# Patient Record
Sex: Male | Born: 1955 | Race: White | Hispanic: No | Marital: Married | Smoking: Never smoker
Health system: Southern US, Community
[De-identification: ages and names within clinical notes are randomized; demographics above are authoritative.]

## PROBLEM LIST (undated history)

## (undated) DIAGNOSIS — I10 Essential (primary) hypertension: Secondary | ICD-10-CM

## (undated) HISTORY — PX: OTHER SURGICAL HISTORY: SHX169

---

## 2008-12-21 ENCOUNTER — Emergency Department: Payer: Self-pay | Admitting: Emergency Medicine

## 2009-09-07 IMAGING — CT CT HEAD WITHOUT CONTRAST
2 series · 16 of 30 positions shown, 20 images · non-contrast
Comparison: none

REASON FOR EXAM: vertigo
COMMENTS:

[Series 2: without · axial · non-contrast · 0.44mm/px · z∈[+470,+600]mm · 13 of 32 slices shown, 17 images]
[im 3/32  brain]
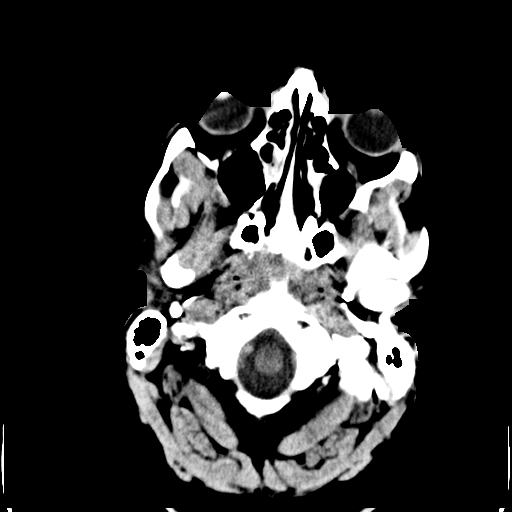
[im 3/32  bone]
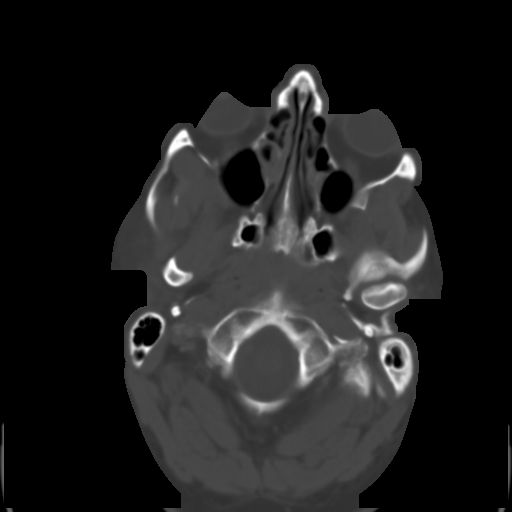
[im 5/32  brain]
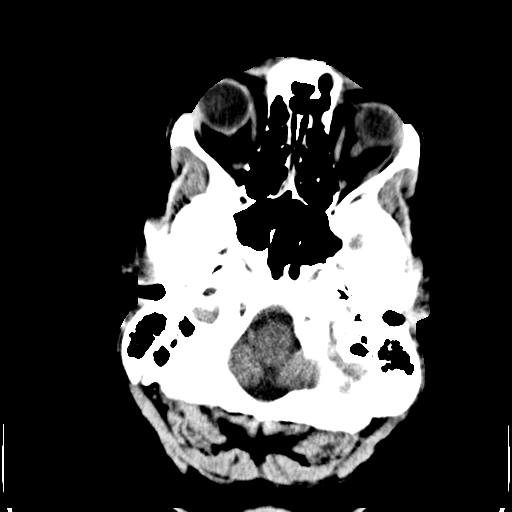
[im 7/32  brain]
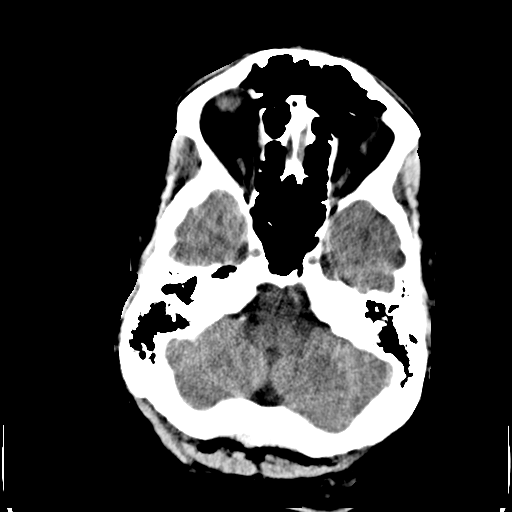
[im 9/32  brain]
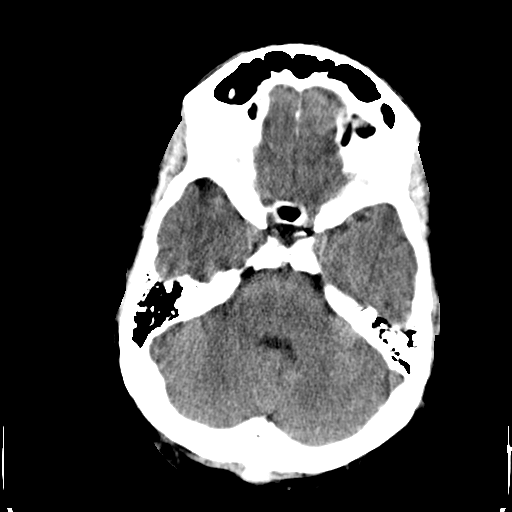
[im 12/32  brain]
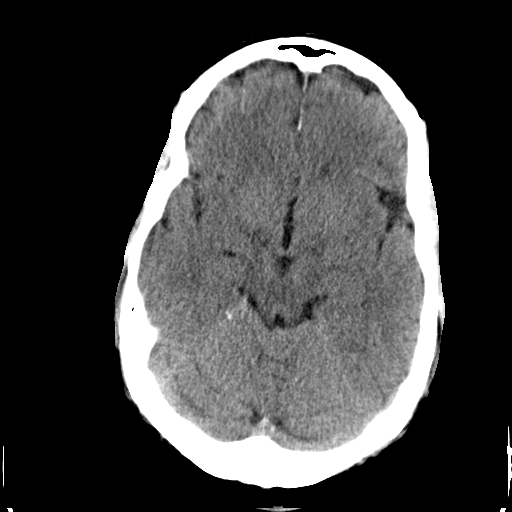
[im 12/32  bone]
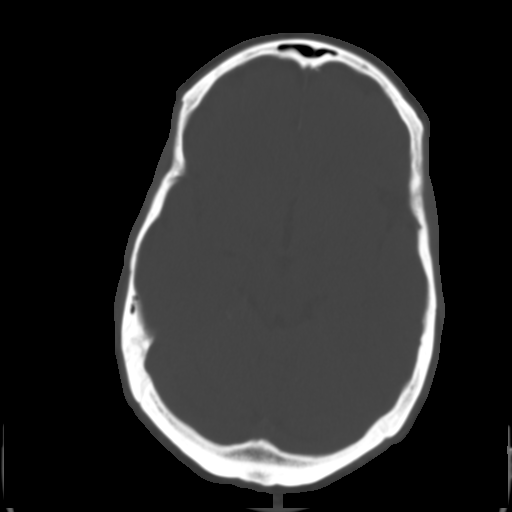
[im 14/32  brain]
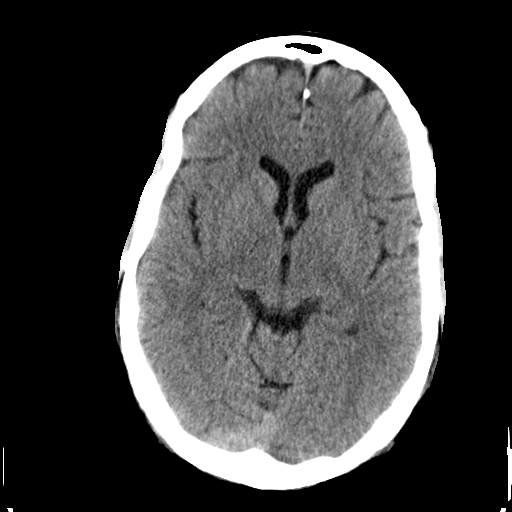
[im 16/32  brain]
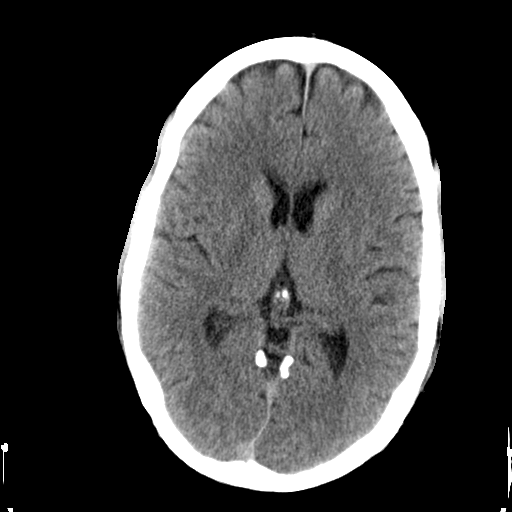
[im 18/32  brain]
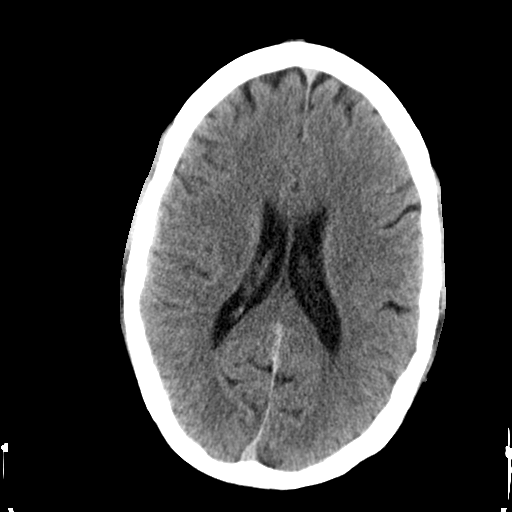
[im 20/32  brain]
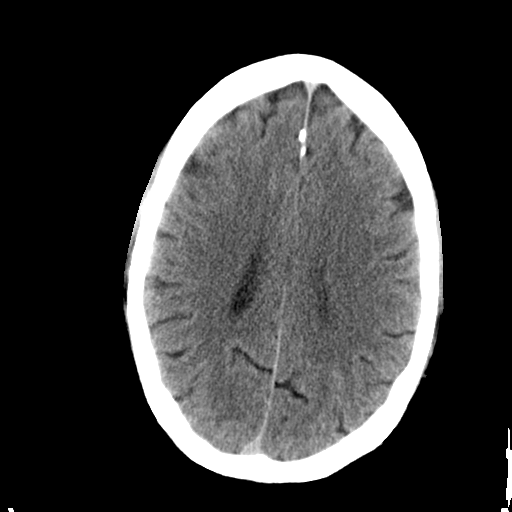
[im 20/32  bone]
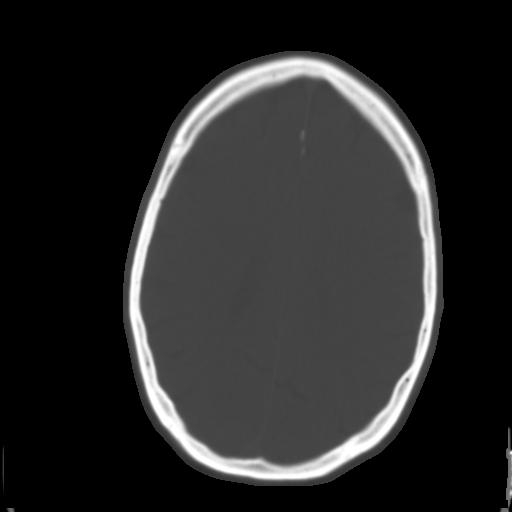
[im 23/32  brain]
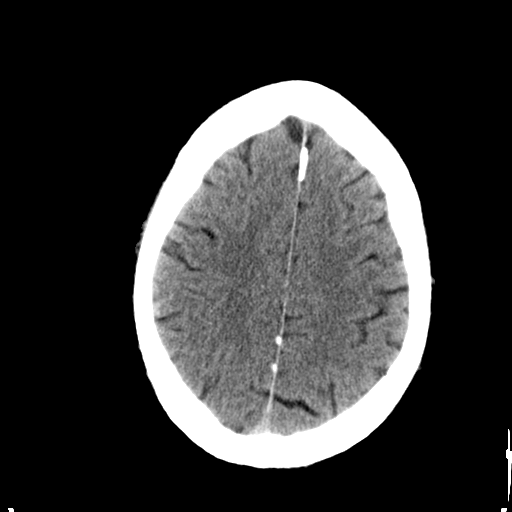
[im 25/32  brain]
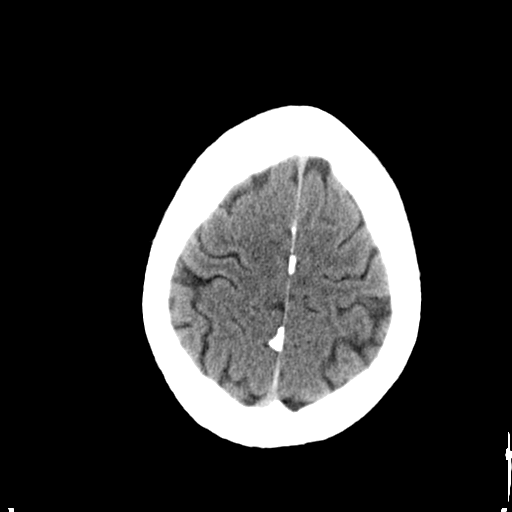
[im 27/32  brain]
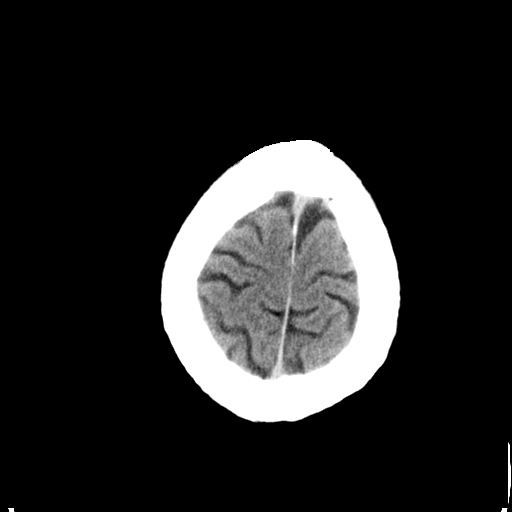
[im 29/32  brain]
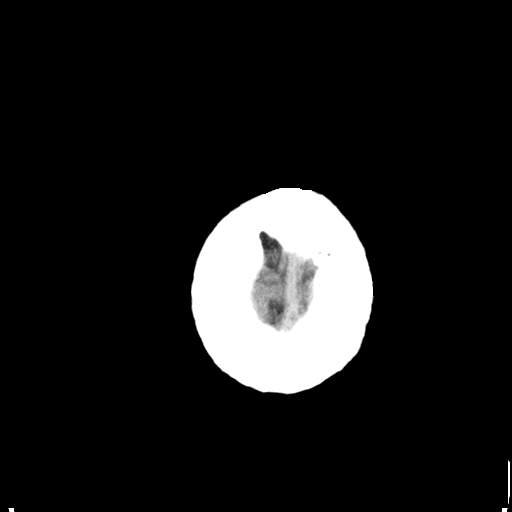
[im 29/32  bone]
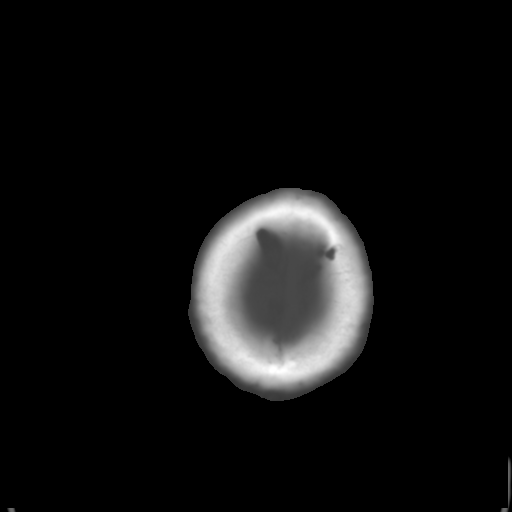

[Series 3: bone · axial · 0.44mm/px · z∈[+470,+514]mm · 3 of 32 slices shown]
[im 3/32  bone]
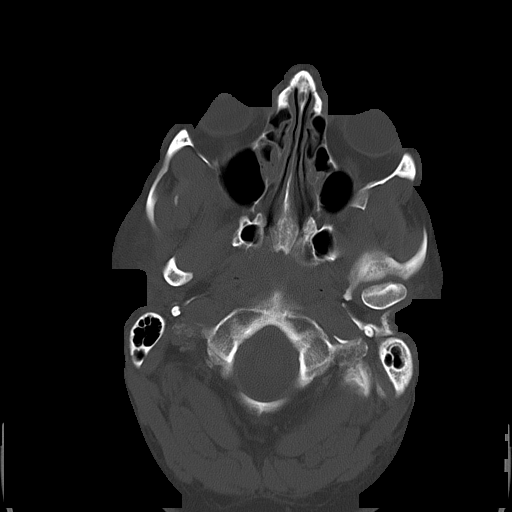
[im 7/32  bone]
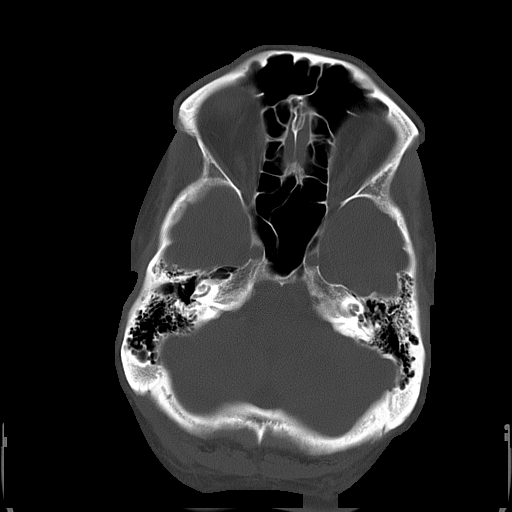
[im 12/32  bone]
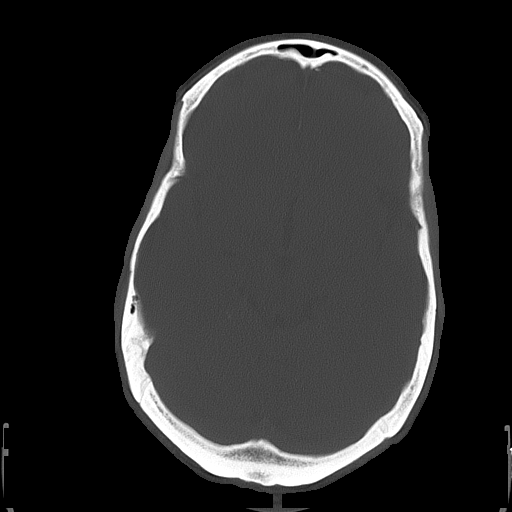

[16 of 30 positions shown; findings below may reference images not displayed]

PROCEDURE:     CT  - CT HEAD WITHOUT CONTRAST  - December 21, 2008  [DATE]

RESULT:     Noncontrast emergent CT of the brain is performed. The patient
has no previous studies for comparison.

The ventricles and sulci are normal. There is no hemorrhage. There is no
focal mass, mass-effect or midline shift. There is no evidence of edema or
territorial infarct. The bone windows demonstrate normal aeration of the
paranasal sinuses and mastoid air cells. There is no skull fracture
demonstrated.
IMPRESSION: 1. No acute intracranial abnormality.

## 2015-01-28 ENCOUNTER — Other Ambulatory Visit: Payer: Self-pay | Admitting: Pediatrics

## 2015-04-14 ENCOUNTER — Other Ambulatory Visit: Payer: Self-pay | Admitting: Pediatrics

## 2015-05-02 ENCOUNTER — Other Ambulatory Visit: Payer: Self-pay | Admitting: Pediatrics

## 2023-01-10 ENCOUNTER — Other Ambulatory Visit: Payer: Self-pay

## 2023-01-10 ENCOUNTER — Emergency Department

## 2023-01-10 ENCOUNTER — Emergency Department
Admission: EM | Admit: 2023-01-10 | Discharge: 2023-01-10 | Disposition: A | Discharge status: Home / Self Care | Attending: Emergency Medicine | Admitting: Emergency Medicine

## 2023-01-10 DIAGNOSIS — I493 Ventricular premature depolarization: Secondary | ICD-10-CM | POA: Diagnosis not present

## 2023-01-10 DIAGNOSIS — R002 Palpitations: Secondary | ICD-10-CM | POA: Diagnosis present

## 2023-01-10 DIAGNOSIS — I10 Essential (primary) hypertension: Secondary | ICD-10-CM | POA: Diagnosis not present

## 2023-01-10 HISTORY — DX: Essential (primary) hypertension: I10

## 2023-01-10 LAB — BASIC METABOLIC PANEL
Anion gap: 7 (ref 5–15)
BUN: 19 mg/dL (ref 8–23)
CO2: 22 mmol/L (ref 22–32)
Calcium: 8.9 mg/dL (ref 8.9–10.3)
Chloride: 109 mmol/L (ref 98–111)
Creatinine, Ser: 0.92 mg/dL (ref 0.61–1.24)
GFR, Estimated: >60 mL/min (ref >60)
Glucose, Bld: 108 mg/dL — ABNORMAL HIGH (ref 70–99)
Potassium: 4 mmol/L (ref 3.5–5.1)
Sodium: 138 mmol/L (ref 135–145)

## 2023-01-10 LAB — CBC
HCT: 46.5 % (ref 39.0–52.0)
Hemoglobin: 16.2 g/dL (ref 13.0–17.0)
MCH: 30.6 pg (ref 26.0–34.0)
MCHC: 34.8 g/dL (ref 30.0–36.0)
MCV: 87.9 fL (ref 80.0–100.0)
Platelets: 219 10*3/uL (ref 150–400)
RBC: 5.29 MIL/uL (ref 4.22–5.81)
RDW: 13.3 % (ref 11.5–15.5)
WBC: 6.7 10*3/uL (ref 4.0–10.5)
nRBC: 0 % (ref 0.0–0.2)

## 2023-01-10 LAB — TROPONIN I (HIGH SENSITIVITY): Troponin I (High Sensitivity): 6 ng/L (ref <18)

## 2023-01-10 NOTE — ED Triage Notes (Signed)
Pt presents to ED with c/o of irregular heart beat. Pt denies cardiac HX other than hypertension. Pt states he feels his heart "go boom boom and then states it stops and he feels another BOOM". Pt states states last night at 2200. Pt denies CP. Pt denies SOB.   Pt states his smart watch said "I was in a-fib".   Pt denies current blood thinner use.

## 2023-01-10 NOTE — ED Provider Notes (Signed)
[facility] Provider Note    Event Date/Time   First MD Initiated Contact with Patient 01/10/23 [phone]     (approximate)   History   Chief Complaint Irregular Heart Beat   HPI  Trevor Gomez is a 67 y.o. male with past medical history of hypertension and hyperlipidemia who presents to the ED complaining of palpitations.  Patient reports that since last night he has a sensation that his heart will occasionally skip a beat or have 1 heavier beat.  He denies any associated pain in his chest and has not had any difficulty breathing.  He is otherwise felt well with no fevers, cough, nausea, vomiting, diarrhea, or dysuria.  Reports he has had similar symptoms in the past but it has seem to persist for longer last night and today.  He denies significant cardiac history.     Physical Exam   Triage Vital Signs: ED Triage Vitals  Enc Vitals Group     BP 01/10/23 0751 (!) 157/110     Pulse Rate 01/10/23 0751 69     Resp 01/10/23 0751 18     Temp 01/10/23 0751 97.6 F (36.4 C)     Temp Source 01/10/23 0751 Oral     SpO2 01/10/23 0751 96 %     Weight --      Height --      Head Circumference --      Peak Flow --      Pain Score 01/10/23 0753 0     Pain Loc --      Pain Edu? --      Excl. in [place]? --     Most recent vital signs: Vitals:   01/10/23 0751 01/10/23 0930  BP: (!) 157/110 128/77  Pulse: 69 62  Resp: 18   Temp: 97.6 F (36.4 C)   SpO2: 96% 98%    Constitutional: Alert and oriented. Eyes: Conjunctivae are normal. Head: Atraumatic. Nose: No congestion/rhinnorhea. Mouth/Throat: Mucous membranes are moist.  Cardiovascular: Normal rate, regular rhythm. Grossly normal heart sounds.  2+ radial pulses bilaterally. Respiratory: Normal respiratory effort.  No retractions. Lungs CTAB. Gastrointestinal: Soft and nontender. No distention. Musculoskeletal: No lower extremity tenderness nor edema.  Neurologic:  Normal speech and language. No gross  focal neurologic deficits are appreciated.    ED Results / Procedures / Treatments   Labs (all labs ordered are listed, but only abnormal results are displayed) Labs Reviewed  BASIC METABOLIC PANEL - Abnormal; Notable for the following components:      Result Value   Glucose, Bld 108 (*)    All other components within normal limits  CBC  TROPONIN I (HIGH SENSITIVITY)     EKG  ED ECG REPORT I, Blake Divine, the attending physician, personally viewed and interpreted this ECG.   Date: 01/10/2023  EKG Time: 7:53  Rate: 62  Rhythm: normal sinus rhythm, frequent PVC's noted  Axis: Normal  Intervals:none  ST&T Change: None  RADIOLOGY Chest x-ray reviewed and interpreted by me with no infiltrate, edema, or effusion.  PROCEDURES:  Critical Care performed: No  .1-3 Lead EKG Interpretation  Performed by: Blake Divine, MD Authorized by: Blake Divine, MD     Interpretation: abnormal     ECG rate:  60-75   ECG rate assessment: normal     Rhythm: sinus rhythm     Ectopy: PVCs     Conduction: normal      MEDICATIONS ORDERED IN ED: Medications - No data  to display   IMPRESSION / MDM / [place] / ED COURSE  I reviewed the triage vital signs and the nursing notes.                              67 y.o. male with past medical history of hypertension and hyperlipidemia who presents to the ED complaining of palpitations since last night.  Patient's presentation is most consistent with acute presentation with potential threat to life or bodily function.  Differential diagnosis includes, but is not limited to, arrhythmia, ACS, PE, anemia, electrolyte abnormality.  Patient well-appearing and in no acute distress, vital signs are unremarkable.  EKG shows normal sinus rhythm with frequent PVCs, no ischemic changes noted.  Labs are reassuring with no significant anemia, leukocytosis, lecture abnormality, or AKI.  Troponin within normal limits and I doubt ACS or  PE, chest x-ray also unremarkable.  Patient appears to be symptomatic from frequent PVCs, no other abnormality noted on cardiac monitor.  With otherwise reassuring workup, patient is appropriate for discharge home with cardiology follow-up and was provided with referral.  He was counseled to return to the ED for new or worsening symptoms, patient agrees with plan.  The patient is on the cardiac monitor to evaluate for evidence of arrhythmia and/or significant heart rate changes.      FINAL CLINICAL IMPRESSION(S) / ED DIAGNOSES   Final diagnoses:  Palpitations  PVC's (premature ventricular contractions)     Rx / DC Orders   ED Discharge Orders          Ordered    Ambulatory referral to Cardiology        01/10/23 0948             Note:  This document was prepared using Dragon voice recognition software and may include unintentional dictation errors.   Blake Divine, MD 01/10/23 [phone]

## 2023-01-21 ENCOUNTER — Ambulatory Visit: Payer: TRICARE For Life (TFL) | Admitting: Cardiology

## 2023-01-21 ENCOUNTER — Encounter: Payer: Self-pay | Admitting: Cardiology

## 2023-01-21 VITALS — BP 153/78 | HR 62 | Ht 72.0 in | Wt 262.6 lb

## 2023-01-21 DIAGNOSIS — I10 Essential (primary) hypertension: Secondary | ICD-10-CM

## 2023-01-21 DIAGNOSIS — R002 Palpitations: Secondary | ICD-10-CM

## 2023-01-21 DIAGNOSIS — I493 Ventricular premature depolarization: Secondary | ICD-10-CM | POA: Insufficient documentation

## 2023-01-21 MED ORDER — AMLODIPINE BESYLATE 5 MG PO TABS: 5.0000 mg | ORAL_TABLET | Freq: Every day | ORAL | 3 refills | Status: DC

## 2023-01-21 NOTE — Progress Notes (Signed)
Patient referred by Ula Lingo, MD for palpitations  Subjective:   Trevor Gomez, male    DOB: 11/06/1956, 67 y.o.   MRN: [id number]   Chief Complaint  Patient presents with   Palpitations   Hospitalization Follow-up          HPI  67 y.o. Caucasian male with hypertension, OSA, referred for palpitations.  Patient recently had a ER visit with complaints of palpitations lasting for.  He has had palpitations before this, but did not last as long as the noted episode.  EKG in the ER showed frequent PVCs.  Symptoms have improved since then, still has occasional palpitations for couple seconds at night.  Denies any chest pain, shortness of breath.  Blood pressure is elevated today.  He was previously using CPAP for OSA, but prefers losing weight if he can avoid having to wear CPAP again.  He was previously on amlodipine 5 mg daily, along with current medication lisinopril 40 mg daily.  He drinks 1-3 caffeinated drinks a day.  Drinks alcohol only occasionally.   Past Medical History:  Diagnosis Date   Hypertension      Past Surgical History:  Procedure Laterality Date   PAE       Social History   Tobacco Use  Smoking Status Never  Smokeless Tobacco Never    Social History   Substance and Sexual Activity  Alcohol Use None     Family History  Problem Relation Age of Onset   Diabetes Mother    Heart attack Mother    Stroke Mother    Hypertension Father    Thyroid disease Sister    Diabetes Brother       Current Outpatient Medications:    azelastine (ASTELIN) 0.1 % nasal spray, Place 1 spray into both nostrils 2 (two) times daily., Disp: , Rfl:    EPINEPHrine 0.3 mg/0.3 mL IJ SOAJ injection, Inject 0.3 mg into the muscle as needed., Disp: , Rfl:    levothyroxine (SYNTHROID) 175 MCG tablet, Take 175 mcg by mouth daily before breakfast., Disp: , Rfl:    lisinopril (ZESTRIL) 40 MG tablet, Take 40 mg by mouth daily., Disp: , Rfl:    omeprazole (PRILOSEC) 20 MG  capsule, Take 20 mg by mouth daily., Disp: , Rfl:    pravastatin (PRAVACHOL) 40 MG tablet, Take 40 mg by mouth daily., Disp: , Rfl:    Cardiovascular and other pertinent studies:  Reviewed external labs and tests, independently interpreted  EKG 01/21/2023: Sinus rhythm 58 bpm Left atrial enlargement Normal EKG  EKG 01/10/2023: Sinus rhythm Possible old inferior infarct Frequent PVCs  Recent labs: 08/02/2022: Glucose 95, BUN/Cr 15/1.02. EGFR 82. Na/K 142/4.5. Rest of the CMP normal CBC NA HbA1C 5.0% Chol 200, TG 223, HDL 38, LDL 117 TSH 0.8 normal   Review of Systems  Cardiovascular:  Positive for palpitations. Negative for chest pain, dyspnea on exertion, leg swelling and syncope.         Vitals:   01/21/23 1318  BP: (!) 153/78  Pulse: 62  SpO2: 97%     Body mass index is 35.61 kg/m. Filed Weights   01/21/23 1318  Weight: 262 lb 9.6 oz (119.1 kg)     Objective:   Physical Exam Vitals and nursing note reviewed.  Constitutional:      General: He is not in acute distress. Neck:     Vascular: No JVD.  Cardiovascular:     Rate and Rhythm: Normal rate and regular rhythm.  Heart sounds: Normal heart sounds. No murmur heard. Pulmonary:     Effort: Pulmonary effort is normal.     Breath sounds: Normal breath sounds. No wheezing or rales.  Musculoskeletal:     Right lower leg: No edema.     Left lower leg: No edema.         Visit diagnoses:   ICD-10-CM   1. PVC (premature ventricular contraction)  I49.3 PCV ECHOCARDIOGRAM COMPLETE    2. Palpitations  R00.2 EKG 12-Lead    3. Essential hypertension  I10 amLODipine (NORVASC) 5 MG tablet    PCV ECHOCARDIOGRAM COMPLETE       Orders Placed This Encounter  Procedures   EKG 12-Lead   PCV ECHOCARDIOGRAM COMPLETE     Medication changes this visit: Medications Discontinued During This Encounter  Medication Reason   amLODipine (NORVASC) 5 MG tablet Completed Course    Meds ordered this encounter   Medications   amLODipine (NORVASC) 5 MG tablet    Sig: Take 1 tablet (5 mg total) by mouth daily.    Dispense:  90 tablet    Refill:  3     Assessment & Recommendations:    67 y.o. Caucasian male with hypertension, OSA, referred for palpitations.  Palpitations: Symptomatic PVCs, have since improved.  Recommend decreasing caffeine intake.  Started metoprolol recurrence of PVCs symptoms.  Hypertension: Uncontrolled.  Resume amlodipine 5 mg daily.  Continue lisinopril 40 mg daily. Check echocardiogram. Discussed diet and lifestyle modification, especially cutting down on late-night snacks and pursuing intermittent fasting, along with calorie control.  Further recommendations after above testing  Thank you for referring the patient to [place]. Please feel free to contact with any questions.   Nigel Mormon, MD Pager: [phone] Office: [phone]

## 2023-01-22 ENCOUNTER — Encounter: Payer: Self-pay | Admitting: Cardiology

## 2023-01-23 ENCOUNTER — Ambulatory Visit: Payer: TRICARE For Life (TFL)

## 2023-01-23 DIAGNOSIS — I10 Essential (primary) hypertension: Secondary | ICD-10-CM

## 2023-01-23 DIAGNOSIS — I493 Ventricular premature depolarization: Secondary | ICD-10-CM

## 2023-04-16 ENCOUNTER — Other Ambulatory Visit: Payer: Self-pay

## 2023-04-16 DIAGNOSIS — I10 Essential (primary) hypertension: Secondary | ICD-10-CM

## 2023-04-16 MED ORDER — AMLODIPINE BESYLATE 5 MG PO TABS: 5.0000 mg | ORAL_TABLET | Freq: Every day | ORAL | 3 refills | Status: DC

## 2023-07-15 ENCOUNTER — Ambulatory Visit: Payer: Medicare Other | Admitting: Cardiology

## 2023-07-15 ENCOUNTER — Encounter: Payer: Self-pay | Admitting: Cardiology

## 2023-07-15 VITALS — BP 137/82 | HR 62 | Ht 72.0 in | Wt 264.0 lb

## 2023-07-15 DIAGNOSIS — E782 Mixed hyperlipidemia: Secondary | ICD-10-CM | POA: Insufficient documentation

## 2023-07-15 DIAGNOSIS — I10 Essential (primary) hypertension: Secondary | ICD-10-CM | POA: Insufficient documentation

## 2023-07-15 DIAGNOSIS — I493 Ventricular premature depolarization: Secondary | ICD-10-CM

## 2023-07-15 NOTE — Progress Notes (Signed)
Patient referred by Charlynn Court, MD for palpitations  Subjective:   Trevor Gomez, male    DOB: 02-Feb-1956, 67 y.o.   MRN: [id number]   Chief Complaint  Patient presents with   Hypertension   Follow-up   pvc     HPI  67 y.o. Caucasian male with hypertension, OSA, referred for palpitations.  Patient is feeling well, denies chest pain, shortness of breath, palpitations, leg edema, orthopnea, PND, TIA/syncope. Blood pressure is well controlled.    Initial consultation visit 12/2022: Patient recently had a ER visit with complaints of palpitations lasting for.  He has had palpitations before this, but did not last as long as the noted episode.  EKG in the ER showed frequent PVCs.  Symptoms have improved since then, still has occasional palpitations for couple seconds at night.  Denies any chest pain, shortness of breath.  Blood pressure is elevated today.  He was previously using CPAP for OSA, but prefers losing weight if he can avoid having to wear CPAP again.  He was previously on amlodipine 5 mg daily, along with current medication lisinopril 40 mg daily.  He drinks 1-3 caffeinated drinks a day.  Drinks alcohol only occasionally.   Current Outpatient Medications:    amLODipine (NORVASC) 5 MG tablet, Take 1 tablet (5 mg total) by mouth daily., Disp: 90 tablet, Rfl: 3   azelastine (ASTELIN) 0.1 % nasal spray, Place 1 spray into both nostrils 2 (two) times daily., Disp: , Rfl:    EPINEPHrine 0.3 mg/0.3 mL IJ SOAJ injection, Inject 0.3 mg into the muscle as needed., Disp: , Rfl:    levothyroxine (SYNTHROID) 175 MCG tablet, Take 175 mcg by mouth daily before breakfast., Disp: , Rfl:    lisinopril (ZESTRIL) 40 MG tablet, Take 40 mg by mouth daily., Disp: , Rfl:    omeprazole (PRILOSEC) 20 MG capsule, Take 20 mg by mouth daily., Disp: , Rfl:    pravastatin (PRAVACHOL) 40 MG tablet, Take 40 mg by mouth daily., Disp: , Rfl:    tadalafil (CIALIS) 20 MG tablet, Take 20 mg by mouth every  morning., Disp: , Rfl:    Cardiovascular and other pertinent studies:  Reviewed external labs and tests, independently interpreted  EKG 07/15/2023: Sinus rhythm 60 bpm  Left atrial enlargement  Echocardiogram 01/23/2023:  Normal LV systolic function with visual EF 60-65%. Left ventricle cavity  is normal in size. Normal left ventricular wall thickness. Normal global  wall motion. Normal diastolic filling pattern, normal LAP.  Aortic valve sclerosis without stenosis.  The aortic root is dilated, 39mm. Proximal ascending aorta not well  visualized.  No prior study for comparison.   Recent labs: 01/10/2023: Glucose 108, BUN/Cr 19/0.92. EGFR >60. Na/K 138/4.0.  Troponin HS 6 normal H/H 16/46. MCV 87. Platelets 219  08/02/2022: Glucose 95, BUN/Cr 15/1.02. EGFR 82. Na/K 142/4.5. Rest of the CMP normal CBC NA HbA1C 5.0% Chol 200, TG 223, HDL 38, LDL 117 TSH 0.8 normal   Review of Systems  Cardiovascular:  Positive for palpitations. Negative for chest pain, dyspnea on exertion, leg swelling and syncope.         Vitals:   07/15/23 1353  BP: 137/82  Pulse: 62  SpO2: 97%      Body mass index is 35.8 kg/m. Filed Weights   07/15/23 1353  Weight: 264 lb (119.7 kg)      Objective:   Physical Exam Vitals and nursing note reviewed.  Constitutional:      General: He  is not in acute distress. Neck:     Vascular: No JVD.  Cardiovascular:     Rate and Rhythm: Normal rate and regular rhythm.     Heart sounds: Normal heart sounds. No murmur heard. Pulmonary:     Effort: Pulmonary effort is normal.     Breath sounds: Normal breath sounds. No wheezing or rales.  Musculoskeletal:     Right lower leg: No edema.     Left lower leg: No edema.         Visit diagnoses:   ICD-10-CM   1. PVC (premature ventricular contraction)  I49.3 EKG 12-Lead    2. Essential hypertension  I10     3. Mixed hyperlipidemia  E78.2 Lipid panel        Orders Placed This Encounter   Procedures   Lipid panel   EKG 12-Lead     Medication changes this visit: Medications Discontinued During This Encounter  Medication Reason   lisinopril (ZESTRIL) 40 MG tablet Duplicate        Assessment & Recommendations:    67 y.o. Caucasian male with hypertension, OSA, referred for palpitations.  Palpitations: Symptomatic PVCs, now improved. Structurally normal heart.  Hypertension: Controlled.  Mixed hyperlipidemia: Check lipid panel. If no improvement, would recommend changing pravastatin to Crestor or Lipitor.   F/u in 6 months. In future, could consider as needed follow up.   Elder Negus, MD Pager: [phone] Office: [phone]

## 2023-07-24 ENCOUNTER — Ambulatory Visit: Payer: Medicare Other | Admitting: Cardiology

## 2023-09-05 ENCOUNTER — Encounter: Payer: Self-pay | Admitting: Cardiology

## 2023-09-06 ENCOUNTER — Other Ambulatory Visit: Payer: Self-pay | Admitting: Cardiology

## 2023-09-06 DIAGNOSIS — I340 Nonrheumatic mitral (valve) insufficiency: Secondary | ICD-10-CM

## 2023-09-06 NOTE — Telephone Encounter (Signed)
From patient.

## 2024-01-15 ENCOUNTER — Ambulatory Visit: Payer: Self-pay | Admitting: Cardiology

## 2024-01-22 ENCOUNTER — Ambulatory Visit: Payer: Medicare Other | Attending: Cardiology | Admitting: Cardiology

## 2024-01-22 ENCOUNTER — Encounter: Payer: Self-pay | Admitting: Cardiology

## 2024-01-22 VITALS — BP 138/82 | HR 62 | Ht 72.0 in | Wt 264.2 lb

## 2024-01-22 DIAGNOSIS — E782 Mixed hyperlipidemia: Secondary | ICD-10-CM | POA: Diagnosis present

## 2024-01-22 DIAGNOSIS — G4733 Obstructive sleep apnea (adult) (pediatric): Secondary | ICD-10-CM

## 2024-01-22 DIAGNOSIS — I340 Nonrheumatic mitral (valve) insufficiency: Secondary | ICD-10-CM

## 2024-01-22 DIAGNOSIS — I10 Essential (primary) hypertension: Secondary | ICD-10-CM | POA: Diagnosis present

## 2024-01-22 NOTE — Patient Instructions (Addendum)
Medication Instructions:  Your physician recommends that you continue on your current medications as directed. Please refer to the Current Medication list given to you today.  *If you need a refill on your cardiac medications before your next appointment, please call your pharmacy*  Testing/Procedures: Your physician has requested that you have an echocardiogram in May of 2025. Echocardiography is a painless test that uses sound waves to create images of your heart. It provides your doctor with information about the size and shape of your heart and how well your heart's chambers and valves are working. This procedure takes approximately one hour. There are no restrictions for this procedure. Please do NOT wear cologne, perfume, aftershave, or lotions (deodorant is allowed). Please arrive 15 minutes prior to your appointment time.  Please note: We ask at that you not bring children with you during ultrasound (echo/ vascular) testing. Due to room size and safety concerns, children are not allowed in the ultrasound rooms during exams. Our front office staff cannot provide observation of children in our lobby area while testing is being conducted. An adult accompanying a patient to their appointment will only be allowed in the ultrasound room at the discretion of the ultrasound technician under special circumstances. We apologize for any inconvenience.   Your physician has requested that you have a coronary calcium score performed. This is not covered by insurance and will be an out-of-pocket cost of approximately $99.    Your physician has recommended that you have a sleep study. This test records several body functions during sleep, including: brain activity, eye movement, oxygen and carbon dioxide blood levels, heart rate and rhythm, breathing rate and rhythm, the flow of air through your mouth and nose, snoring, body muscle movements, and chest and belly movement.  OUR SLEEP STUDY COORDINATORS WILL  CONTACT YOU SOON TO ARRANGE THIS TEST AFTER PRE CERT. (NINA JONES OR LYNN VIA)   Follow-Up: At [facility], you and your health needs are our priority.  As part of our continuing mission to provide you with exceptional heart care, we have created designated Provider Care Teams.  These Care Teams include your primary Cardiologist (physician) and Advanced Practice Providers (APPs -  Physician Assistants and Nurse Practitioners) who all work together to provide you with the care you need, when you need it.  We recommend signing up for the patient portal called "MyChart".  Sign up information is provided on this After Visit Summary.  MyChart is used to connect with patients for Virtual Visits (Telemedicine).  Patients are able to view lab/test results, encounter notes, upcoming appointments, etc.  Non-urgent messages can be sent to your provider as well.   To learn more about what you can do with MyChart, go to [email or web address].    Your next appointment:   June  Provider:   Dr. Rosemary Holms   Other Instructions   1st Floor: - Lobby - Registration  - Pharmacy  - Lab - Cafe  2nd Floor: - PV Lab - Diagnostic Testing (echo, CT, nuclear med)  3rd Floor: - Vacant  4th Floor: - TCTS (cardiothoracic surgery) - AFib Clinic - Structural Heart Clinic - Vascular Surgery  - Vascular Ultrasound  5th Floor: - [facility] (general and EP) - Clinical Pharmacy for coumadin, hypertension, lipid, weight-loss medications, and med management appointments    Valet parking services will be available as well.

## 2024-01-22 NOTE — Progress Notes (Signed)
Cardiology Office Note:  .   Date:  01/22/2024  ID:  Trevor Gomez, DOB 1956-08-21, MRN [id number] PCP: Charlynn Court, MD  [facility] Providers Cardiologist:  Truett Mainland, MD PCP: Charlynn Court, MD  Chief Complaint  Patient presents with   Mitral Regurgitation      History of Present Illness: .    Trevor Gomez is a 68 y.o. male with hypertension, OSA, symptomatically PVCs, mitral regurgitation  Patient is staying active with regular walking without recurrence of chest pain, shortness of breath.  Palpitations have resolved.  In 09/2023, his PCP noted a murmur on exam which prompted echocardiogram performed at [facility]  I reviewed the report that shows mild mitral prolapse with moderate mitral regurgitation.  Separately, the previously wore CPAP for OSA, but has been off it since then.  He does report snoring at night.  Blood pressure slightly elevated today.   Vitals:   01/22/24 1546  BP: 138/82  Pulse: 62  SpO2: 97%     ROS:  Review of Systems  Cardiovascular:  Negative for chest pain, dyspnea on exertion, leg swelling, palpitations and syncope.     Studies Reviewed: Marland Kitchen         Independently interpreted 07/2023: Chol 163, TG 168, HDL 41, LDL 93  Echocardiogram 09/2023:   1. The left ventricle is normal in size with mildly increased wall  thickness.   2. The left ventricular systolic function is normal, LVEF is visually  estimated at > 55%.    3. The mitral valve leaflets are mildly thickened with mild prolapse.    4. There is probably moderate mitral valve regurgitation.    5. The aortic valve is trileaflet with mildly thickened leaflets with normal  excursion.   6. The right ventricle is normal in size, with normal systolic function.      Physical Exam:   Physical Exam Vitals and nursing note reviewed.  Constitutional:      General: He is not in acute distress. Neck:     Vascular: No JVD.  Cardiovascular:     Rate and Rhythm: Normal rate  and regular rhythm.     Heart sounds: Murmur heard.     High-pitched blowing holosystolic murmur is present with a grade of 3/6 at the apex.  Pulmonary:     Effort: Pulmonary effort is normal.     Breath sounds: Normal breath sounds. No wheezing or rales.  Musculoskeletal:     Right lower leg: No edema.     Left lower leg: No edema.      VISIT DIAGNOSES:   ICD-10-CM   1. Primary hypertension  I10     2. Nonrheumatic mitral valve regurgitation  I34.0 ECHOCARDIOGRAM COMPLETE    3. Mixed hyperlipidemia  E78.2 CT CARDIAC SCORING (SELF PAY ONLY)    4. OSA (obstructive sleep apnea)  G47.33 Split night study       ASSESSMENT AND PLAN: .    Trevor Gomez is a 68 y.o. male with hypertension, OSA, symptomatically PVCs, mitral regurgitation  Mitral regurgitation: Moderate MR, with mild mitral prolapse. This is new finding since his last office visit and echocardiogram with me in 12/2022. No associated symptoms. Repeat echocardiogram in 04/2024.  Hypertension: Blood pressure slightly elevated.  Known OSA, currently not on CPAP. Refer to sleep study. In addition to current antihypertensive therapy, CPAP could further reduce his blood pressure.  PVCs: Resolved  Mixed hyperlipidemia: LDL 93 on pravastatin 40 mg daily. Check calcium score scan  for risk stratification. If calcium significantly elevated, will switch pravastatin to Crestor 20 mg daily with goal LDL <70.  If calcium score 0, reasonable to continue pravastatin 40 mg daily.    F/u in 05/2024  Signed, Elder Negus, MD

## 2024-01-24 ENCOUNTER — Encounter: Payer: Self-pay | Admitting: *Deleted

## 2024-01-24 ENCOUNTER — Telehealth: Payer: Self-pay | Admitting: *Deleted

## 2024-01-24 ENCOUNTER — Telehealth: Payer: Self-pay

## 2024-01-24 ENCOUNTER — Ambulatory Visit
Admission: RE | Admit: 2024-01-24 | Discharge: 2024-01-24 | Disposition: A | Discharge status: Home / Self Care | Payer: Self-pay | Source: Ambulatory Visit | Attending: Cardiology

## 2024-01-24 DIAGNOSIS — I340 Nonrheumatic mitral (valve) insufficiency: Secondary | ICD-10-CM

## 2024-01-24 DIAGNOSIS — Z79899 Other long term (current) drug therapy: Secondary | ICD-10-CM

## 2024-01-24 DIAGNOSIS — E782 Mixed hyperlipidemia: Secondary | ICD-10-CM | POA: Insufficient documentation

## 2024-01-24 DIAGNOSIS — I2510 Atherosclerotic heart disease of native coronary artery without angina pectoris: Secondary | ICD-10-CM

## 2024-01-24 DIAGNOSIS — R011 Cardiac murmur, unspecified: Secondary | ICD-10-CM

## 2024-01-24 MED ORDER — ROSUVASTATIN CALCIUM 20 MG PO TABS: 20.0000 mg | ORAL_TABLET | Freq: Every day | ORAL | 2 refills | Status: DC

## 2024-01-24 NOTE — Telephone Encounter (Signed)
**Note De-Identified Olar Santini Obfuscation** Per the Traditional Medicare part A and B: Medicare Part B covers any of these sleep studies to screen for obstructive sleep apnea when you have clinical signs and symptoms of the condition. You also have to meet the following criteria:  The sleep study is done in a clinic or hospital that accepts Medicare, if it is a type I test. Your primary doctor refers you. Medical evidence, such as lab results or records of physical exams, confirms that you need a sleep test.  I have forwarded the pts Split Night Sleep Study order to the Sleep Lab so they can contact the pt to schedule the test.

## 2024-01-24 NOTE — Addendum Note (Signed)
Addended by: Elder Negus on: 01/24/2024 04:07 PM   Modules accepted: Orders

## 2024-01-24 NOTE — Progress Notes (Signed)
CT scan shows significant amount of calcium in 2 arteries.  This does not necessarily indicate severe blockages, although does warrant further workup.  Recommend exercise nuclear stress test to evaluate for any reduced blood flow to heart from plaque buildup.  Also, recommend switching pravastatin to Crestor 20 mg daily with goal LDL <55.  Recommend repeat lipid panel, along with lipoprotein a checked in 3 months.  Thanks MJP

## 2024-01-24 NOTE — Telephone Encounter (Signed)
The patient has been notified of the result and verbalized understanding.  All questions (if any) were answered.  Pt aware we will order for him to get an exercise myoview done.  He is aware that I will place the order and have our scheduler reach out to him to arrange this appt.  Pt is aware that I will send him his myoview instructions to his mychart to review.   Pt aware we will stop his pravastatin and switch him to crestor 20 mg po daily instead.  He is aware we will repeat his lipids and LipoA in 3 months to reassess.  Pt aware I will mail him the lab requisitions to his mailing address, to serve for a reminder to have these done in 3 months.   Confirmed the pharmacy of choice with the pt.  Pt verbalized understanding and agrees with this plan.

## 2024-01-24 NOTE — Addendum Note (Signed)
Addended by: Loa Socks on: 01/24/2024 04:02 PM   Modules accepted: Orders

## 2024-01-24 NOTE — Telephone Encounter (Signed)
-----   Message from [facility] sent at 01/24/2024  2:17 PM EST ----- CT scan shows significant amount of calcium in 2 arteries.  This does not necessarily indicate severe blockages, although does warrant further workup.  Recommend exercise nuclear stress test to evaluate for any reduced blood flow to heart from plaque buildup.  Also, recommend switching pravastatin to Crestor 20 mg daily with goal LDL <55.  Recommend repeat lipid panel, along with lipoprotein a checked in 3 months.  Thanks MJP

## 2024-01-27 ENCOUNTER — Encounter (HOSPITAL_COMMUNITY): Payer: Self-pay

## 2024-01-29 ENCOUNTER — Ambulatory Visit (HOSPITAL_COMMUNITY): Payer: Medicare Other | Attending: Cardiology

## 2024-01-29 ENCOUNTER — Telehealth: Payer: Self-pay | Admitting: *Deleted

## 2024-01-29 DIAGNOSIS — R011 Cardiac murmur, unspecified: Secondary | ICD-10-CM | POA: Diagnosis present

## 2024-01-29 DIAGNOSIS — I340 Nonrheumatic mitral (valve) insufficiency: Secondary | ICD-10-CM | POA: Diagnosis present

## 2024-01-29 DIAGNOSIS — I2510 Atherosclerotic heart disease of native coronary artery without angina pectoris: Secondary | ICD-10-CM | POA: Insufficient documentation

## 2024-01-29 DIAGNOSIS — E782 Mixed hyperlipidemia: Secondary | ICD-10-CM | POA: Diagnosis present

## 2024-01-29 DIAGNOSIS — Z79899 Other long term (current) drug therapy: Secondary | ICD-10-CM | POA: Insufficient documentation

## 2024-01-29 LAB — MYOCARDIAL PERFUSION IMAGING
Angina Index: 0
Duke Treadmill Score: 9
Estimated workload: 10.1
Exercise duration (min): 9 min
Exercise duration (sec): 0 s
LV dias vol: 106 mL (ref 62–150)
LV sys vol: 50 mL
MPHR: 155 {beats}/min
Nuc Stress EF: 53 %
Peak HR: 151 {beats}/min
Percent HR: 97 %
Rest HR: 52 {beats}/min
Rest Nuclear Isotope Dose: 10.9 mCi
SDS: 0
SRS: 0
SSS: 0
ST Depression (mm): 0 mm
Stress Nuclear Isotope Dose: 31.5 mCi
TID: 0.75

## 2024-01-29 MED ORDER — TECHNETIUM TC 99M TETROFOSMIN IV KIT
10.9000 | PACK | Freq: Once | INTRAVENOUS | Status: AC | PRN
  Administered 2024-01-29: 10.9 via INTRAVENOUS

## 2024-01-29 MED ORDER — TECHNETIUM TC 99M TETROFOSMIN IV KIT
31.5000 | PACK | Freq: Once | INTRAVENOUS | Status: AC | PRN
  Administered 2024-01-29: 31.5 via INTRAVENOUS

## 2024-01-29 NOTE — Telephone Encounter (Signed)
Ready-PA not REQ per Medicare Part B. LV   I have forwarded the pts Split Night Sleep Study order to the Sleep Lab so they can contact the pt to schedule the test.

## 2024-01-29 NOTE — Telephone Encounter (Signed)
-----   Message from Nurse Corky Crafts sent at 01/24/2024  5:51 PM EST ----- Regarding: FW: SPLIT NIGHT SLEEP STUDY ONLY PER PAT[place] Thank you for the update.  Can we get this precerted so that they can call and arrange?  Thanks, Lajoyce Corners ----- Message ----- From: Reesa Chew, CMA Sent: 01/24/2024   5:47 PM EST To: Loa Socks, LPN Subject: RE: SPLIT NIGHT SLEEP STUDY ONLY PER PATWARD#  Hello Ivy, I don't call to arrange the studies or schedule them that is the sleep lab. I just help get the test approved and the sleep lab calls the patient and schedules them. They have the calendar. ----- Message ----- From: Loa Socks, LPN Sent: 4/78/2956   4:07 PM EST To: Reesa Chew, CMA; Lorelle Formosa Via, LPN; # Subject: SPLIT NIGHT SLEEP STUDY ONLY PER PAT[facility]AN    Dr. Rosemary Holms wants this pt to have an in lab split night sleep study done for OSA  Order is in  Pt aware you will call to arrange  Can you please call and schedule and shoot me the date thereafter?   Thanks, [employer]

## 2024-01-30 ENCOUNTER — Telehealth: Payer: Self-pay | Admitting: *Deleted

## 2024-01-30 ENCOUNTER — Encounter: Payer: Self-pay | Admitting: Cardiology

## 2024-01-30 NOTE — Telephone Encounter (Signed)
-----   Message from [facility] Apolinar Junes J sent at 01/29/2024  5:46 PM EST ----- Regarding: RE: SPLIT NIGHT SLEEP STUDY ONLY PER [facility]AN I have forwarded the pts Split Night Sleep Study order to the Sleep Lab so they can contact the pt to schedule the test. LV ----- Message ----- From: Loa Socks, LPN Sent: 0/86/5784   5:52 PM EST To: Reesa Chew, CMA Subject: FW: SPLIT NIGHT SLEEP STUDY ONLY PER PATWARD#  Thank you for the update.  Can we get this precerted so that they can call and arrange?  Thanks, Lajoyce Corners ----- Message ----- From: Reesa Chew, CMA Sent: 01/24/2024   5:47 PM EST To: Loa Socks, LPN Subject: RE: SPLIT NIGHT SLEEP STUDY ONLY PER PATWARD#  Hello Keari Miu, I don't call to arrange the studies or schedule them that is the sleep lab. I just help get the test approved and the sleep lab calls the patient and schedules them. They have the calendar. ----- Message ----- From: Loa Socks, LPN Sent: 6/96/2952   4:07 PM EST To: Reesa Chew, CMA; Lorelle Formosa Via, LPN; # Subject: SPLIT NIGHT SLEEP STUDY ONLY PER [facility]    Dr. Rosemary Holms wants this pt to have an in lab split night sleep study done for OSA  Order is in  Pt aware you will call to arrange  Can you please call and schedule and shoot me the date thereafter?   Thanks, [employer]

## 2024-03-23 ENCOUNTER — Ambulatory Visit (HOSPITAL_BASED_OUTPATIENT_CLINIC_OR_DEPARTMENT_OTHER): Payer: Medicare Other | Attending: Cardiology | Admitting: Cardiology

## 2024-03-23 DIAGNOSIS — G4736 Sleep related hypoventilation in conditions classified elsewhere: Secondary | ICD-10-CM | POA: Diagnosis not present

## 2024-03-23 DIAGNOSIS — G4733 Obstructive sleep apnea (adult) (pediatric): Secondary | ICD-10-CM | POA: Insufficient documentation

## 2024-03-30 NOTE — Procedures (Signed)
  [facility] [address] [place], [place] [address] Tel: [phone]   Fax: [phone]  Polysomnography Interpretation  Patient Name:  Trevor Gomez, Trevor Gomez Date:  03/23/2024 Referring Physician:  Dr. Elder Negus  Indications for Polysomnography The patient is a 68 year old Male who is 6' and weighs 250.0 lbs. Her BMI equals 34.2.  A full night polysomnogram was performed to evaluate for Obstructive Sleep Apnea.  Medication were taken at 9:45 pm  Afrin  Lisinopril  Amlodipine Besylate  L-thyroxine  Rosuvastatin  omeprazole   Polysomnogram Data A full night polysomnogram recorded the standard physiologic parameters including EEG, EOG, EMG, EKG, nasal and oral airflow.  Respiratory parameters of chest and abdominal movements were recorded with Respiratory Inductance Plethysmography belts.  Oxygen saturation was recorded by pulse oximetry.   Sleep Architecture The total recording time of the polysomnogram was 413.8 minutes.  The total sleep time was 357.0 minutes.  The patient spent 11.9% of total sleep time in Stage N1, 64.1% in Stage N2, 0.0% in Stages N3, and 23.9% in REM.  Sleep latency was 29.3 minutes.  REM latency was 62.0 minutes.  Sleep Efficiency was 86.3%.  Wake after Sleep Onset time was 27.5 minutes.  Respiratory Events The polysomnogram revealed a presence of 7 obstructive, 0 central, and 1 mixed Apneas resulting in an Apnea index of 1.3 events per hour.  There were 93 hypopneas (>=3% desaturation and/or arousal) resulting in an Apnea\Hypopnea Index (AHI >=3% desaturation and/or arousal) of 17.0 events per hour.  There were 62 hypopneas (>=4% desaturation) resulting in an Apnea\Hypopnea Index (AHI >=4% desaturation) of 11.8 events per hour.  There were 31 Respiratory Effort Related Arousals resulting in a RERA index of 5.2 events per hour. The Respiratory Disturbance Index is 22.2 events per hour.  The snore index was 426.1  events per hour.  Mean oxygen saturation was 93.2%.  The lowest oxygen saturation during sleep was 79.0%.  Time spent <=88% oxygen saturation was 8.8 minutes (2.1%).  Limb Activity There were 0 total limb movements recorded.  Cardiac Summary The average pulse rate was 51.7 bpm.  The minimum pulse rate was 46.0 bpm while the maximum pulse rate was 78.0 bpm.  Cardiac rhythm was normal.  Diagnosis:  Mild Obstructive Sleep Apnea overall but severe during REM Sleep (REM AHI 40/hr) Nocturnal Hypoxemia  Recommendations: Recommend in lab CPAP titration for treatment of sleep disordered breathing and snoring. The patient should be counseled in good sleep hygiene. The patient should be counseled in avoiding sleeping supine. Consider ENT referral to evaluate for surgical causes of sleep disordered breathing and snoring.   This study was personally reviewed and electronically signed by: Dr. Armanda Magic Accredited Board Certified in Sleep Medicine Date/Time: 03/30/2024 8:24PM

## 2024-04-07 ENCOUNTER — Telehealth: Payer: Self-pay

## 2024-04-07 DIAGNOSIS — I10 Essential (primary) hypertension: Secondary | ICD-10-CM

## 2024-04-07 DIAGNOSIS — G4733 Obstructive sleep apnea (adult) (pediatric): Secondary | ICD-10-CM

## 2024-04-07 NOTE — Telephone Encounter (Signed)
 Left VM with callback number for patient to receive sleep study results and recommendations.

## 2024-04-07 NOTE — Telephone Encounter (Signed)
-----   Message from Armanda Magic sent at 03/30/2024  8:25 PM EDT ----- Please let patient know that they have sleep apnea.  Recommend therapeutic CPAP titration for treatment of patient's sleep disordered breathing.

## 2024-04-30 NOTE — Telephone Encounter (Addendum)
 The patient has been notified of the result via his mychart. Patient has been encouraged to call back.Gaylene Kays, CMA 04/30/2024 6:24 PM     RETURN CALL:  The patient has been notified of the result and verbalized understanding.  All questions (if any) were answered. Gaylene Kays, CMA 04/30/2024 6:33 PM    Patient will call back with his decision to move forward or not. He wants to think about things first.

## 2024-04-30 NOTE — Addendum Note (Signed)
 Addended by: Joslyn Nim on: 04/30/2024 06:34 PM   Modules accepted: Orders

## 2024-05-27 ENCOUNTER — Ambulatory Visit (HOSPITAL_COMMUNITY)
Admission: RE | Admit: 2024-05-27 | Discharge: 2024-05-27 | Disposition: A | Discharge status: Home / Self Care | Payer: Medicare Other | Source: Ambulatory Visit | Attending: Cardiovascular Disease | Admitting: Cardiovascular Disease

## 2024-05-27 DIAGNOSIS — I340 Nonrheumatic mitral (valve) insufficiency: Secondary | ICD-10-CM | POA: Diagnosis present

## 2024-05-27 LAB — ECHOCARDIOGRAM COMPLETE
Area-P 1/2: 3.65 cm2
S' Lateral: 2.8 cm

## 2024-05-27 NOTE — Progress Notes (Signed)
 Cardiology Office Note:  .   Date:  05/27/2024  ID:  Trevor Gomez, DOB 1956-05-09, MRN [id number] PCP: Kristina Pfeiffer, MD  [facility] Providers Cardiologist:  Fransico Ivy, MD PCP: Kristina Pfeiffer, MD  Chief Complaint  Patient presents with   Hyperlipidemia   Hypertension   PVC   Heart Murmur      History of Present Illness: .    Trevor Gomez is a 68 y.o. male with hypertension, OSA, elevated CAC, mitral regurgitation, dilated aortic root  Patient is here with his wife today.  He is staying active with regular walking and exercise, without any complaints of chest pain or shortness of breath.  Reviewed recent echocardiogram, calcium  score scan, stress test results with the patient, details below.  Also reviewed sleep study that showed mild OSA.  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  He is not too keen on wearing CPAP again.   Vitals:   06/01/24 0901  BP: (!) 147/83  Pulse: (!) 57  SpO2: 96%      ROS:  Review of Systems  Cardiovascular:  Negative for chest pain, dyspnea on exertion, leg swelling, palpitations and syncope.     Studies Reviewed: Trevor Gomez        EKG 06/01/2024: Sinus bradycardia When compared with ECG of 10-Jan-2023 07:53, Premature ventricular complexes are no longer Present     Labs 07/2023: Chol 163, TG 168, HDL 41, LDL 93  Echocardiogram 04/2024:  1. Left ventricular ejection fraction, by estimation, is 60 to 65%. Left ventricular ejection fraction by 3D volume is 62 %. The left ventricle has normal function. The left ventricle has no regional wall motion abnormalities. There is mild concentric  left ventricular hypertrophy. Left ventricular diastolic parameters are indeterminate.  2. Right ventricular systolic function is normal. The right ventricular size is mildly enlarged.  3. Horizontal color splay of regurgitation signal- color  may understimate regurgitation severity. Systolic flow reversal of the left inferior pulmonary vein flow; TVI ratio of only 0.75. Moderate to severe mtiral regurgitation without evidence of ventricular dilation or dysfunction or atrial remodeling. The mitral valve is normal in structure. Moderate to severe mitral valve regurgitation. No evidence of mitral stenosis. There is moderate late systolic prolapse of both leaflets of the mitral valve.  4. The aortic valve is tricuspid. There is mild calcification of the aortic valve. Aortic valve regurgitation is not visualized. No aortic stenosis is present.  5. Aortic dilatation noted. There is mild dilatation of the aortic root, measuring 42 mm. There is mild dilatation of the ascending aorta, measuring 40 mm. There is borderline dilatation of the aortic arch, measuring 32 mm.  6. The inferior vena cava is normal in size with greater than 50% respiratory variability, suggesting right atrial pressure of 3 mmHg.  7. Ascending aorta measurements are within normal limits for age when indexed to body surface area.   Comparison(s): Unable to view 2024 study.  CT cardiac scoring 01/2024: LM 0  LAD 222 LCx 0 RCA 143   Total 366 (78th percentile)   Stress test 01/2024:   The study is normal. The study is low risk.   A Bruce protocol stress test was performed. Exercise capacity was excellent. Patient exercised for 9 min and 0 sec. Maximum HR of 151 bpm. MPHR 97.0%. Peak METS 10.1. The patient experienced no angina during the test. The  patient achieved the target heart rate. The patient reported no symptoms during the stress test. Normal blood pressure and normal heart rate response noted during stress. Heart rate recovery was normal.   No ST deviation was noted. The ECG was negative for ischemia.   LV perfusion is normal. There is no evidence of ischemia. There is no evidence of infarction.   Left ventricular function is normal. Nuclear stress EF: 53%. End  diastolic cavity size is normal. End systolic cavity size is normal. No evidence of transient ischemic dilation (TID) noted.   Prior study not available for comparison.   Physical Exam:   Physical Exam Vitals and nursing note reviewed.  Constitutional:      General: He is not in acute distress. Neck:     Vascular: No JVD.  Cardiovascular:     Rate and Rhythm: Normal rate and regular rhythm.     Heart sounds: Murmur heard.     High-pitched blowing holosystolic murmur is present with a grade of 3/6 at the apex.  Pulmonary:     Effort: Pulmonary effort is normal.     Breath sounds: Normal breath sounds. No wheezing or rales.  Musculoskeletal:     Right lower leg: No edema.     Left lower leg: No edema.      VISIT DIAGNOSES:   ICD-10-CM   1. Nonrheumatic mitral valve regurgitation  I34.0 ECHOCARDIOGRAM COMPLETE    2. Mixed hyperlipidemia  E78.2 EKG 12-Lead    Lipid panel    3. Essential hypertension  I10 EKG 12-Lead    amLODipine  (NORVASC ) 5 MG tablet    AMB Referral to [facility] Pharm-D    4. Dilated aortic root (HCC)  I77.810         ASSESSMENT AND PLAN: .    Trevor Gomez is a 68 y.o. male with hypertension, OSA, elevated CAC, mitral regurgitation, dilated aortic root  Mitral regurgitation: Moderate prolapse with moderate to severe mitral regurgitation. No evidence of LV enlargement or dysfunction. Mild RV dilatation, although no evidence of pulmonary hypertension. In absence of symptoms are signs of LV strain, repeat echocardiogram in 1 year.  Aortic root dilatation: Mild dilation of aortic root at 42 mm, ascending aorta 40 mm.  Hypertension: Uncontrolled. Has mild OSA, but prefers ENT evaluation rather than use of CPAP. Increase amlodipine  from 5 mg daily to 7.5 mg daily.  Continue lisinopril 40 mg daily. Follow-up in 4 weeks with Pharm.D. to reassess hypertension controlled. I encouraged patient to buy a new blood pressure monitor for home  monitoring.  Mixed hyperlipidemia, elevated CAC: 78 percentile calcium  score, but no ischemia on stress testing (01/2024). Continue Crestor  10 mg daily. Check lipid panel today.  F/u in 1 year with me  Signed, Trevor Das, MD

## 2024-06-01 ENCOUNTER — Ambulatory Visit: Payer: Medicare Other | Attending: Cardiology | Admitting: Cardiology

## 2024-06-01 ENCOUNTER — Encounter: Payer: Self-pay | Admitting: Cardiology

## 2024-06-01 VITALS — BP 147/83 | HR 57 | Ht 72.0 in | Wt 260.2 lb

## 2024-06-01 DIAGNOSIS — I10 Essential (primary) hypertension: Secondary | ICD-10-CM | POA: Diagnosis present

## 2024-06-01 DIAGNOSIS — I77810 Thoracic aortic ectasia: Secondary | ICD-10-CM | POA: Diagnosis not present

## 2024-06-01 DIAGNOSIS — E782 Mixed hyperlipidemia: Secondary | ICD-10-CM | POA: Diagnosis not present

## 2024-06-01 DIAGNOSIS — I340 Nonrheumatic mitral (valve) insufficiency: Secondary | ICD-10-CM | POA: Diagnosis not present

## 2024-06-01 MED ORDER — AMLODIPINE BESYLATE 5 MG PO TABS: 7.5000 mg | ORAL_TABLET | Freq: Every day | ORAL | 3 refills | Status: AC

## 2024-06-01 NOTE — Patient Instructions (Addendum)
 Medication Instructions:  INCREASED Amlodipine  to 7.5 mg daily   *If you need a refill on your cardiac medications before your next appointment, please call your pharmacy*  Lab Work: Lipid panel   If you have labs (blood work) drawn today and your tests are completely normal, you will receive your results only by: MyChart Message (if you have MyChart) OR A paper copy in the mail If you have any lab test that is abnormal or we need to change your treatment, we will call you to review the results.  Testing/Procedures: Echo in 1 year   Your physician has requested that you have an echocardiogram. Echocardiography is a painless test that uses sound waves to create images of your heart. It provides your doctor with information about the size and shape of your heart and how well your heart's chambers and valves are working. This procedure takes approximately one hour. There are no restrictions for this procedure. Please do NOT wear cologne, perfume, aftershave, or lotions (deodorant is allowed). Please arrive 15 minutes prior to your appointment time.  Please note: We ask at that you not bring children with you during ultrasound (echo/ vascular) testing. Due to room size and safety concerns, children are not allowed in the ultrasound rooms during exams. Our front office staff cannot provide observation of children in our lobby area while testing is being conducted. An adult accompanying a patient to their appointment will only be allowed in the ultrasound room at the discretion of the ultrasound technician under special circumstances. We apologize for any inconvenience. \  REFERRAL TO PHARM-D IN 4 WEEKS  Follow-Up: At [facility], you and your health needs are our priority.  As part of our continuing mission to provide you with exceptional heart care, our providers are all part of one team.  This team includes your primary Cardiologist (physician) and Advanced Practice Providers or APPs  (Physician Assistants and Nurse Practitioners) who all work together to provide you with the care you need, when you need it.  Your next appointment:   1 year(s)  Provider:   Cody Das, MD    We recommend signing up for the patient portal called "MyChart".  Sign up information is provided on this After Visit Summary.  MyChart is used to connect with patients for Virtual Visits (Telemedicine).  Patients are able to view lab/test results, encounter notes, upcoming appointments, etc.  Non-urgent messages can be sent to your provider as well.   To learn more about what you can do with MyChart, go to [email or web address].

## 2024-06-02 ENCOUNTER — Ambulatory Visit: Payer: Self-pay | Admitting: Cardiology

## 2024-06-02 ENCOUNTER — Other Ambulatory Visit: Payer: Self-pay

## 2024-06-02 DIAGNOSIS — E782 Mixed hyperlipidemia: Secondary | ICD-10-CM

## 2024-06-02 LAB — LIPID PANEL
Chol/HDL Ratio: 4.1 ratio (ref 0.0–5.0)
Cholesterol, Total: 176 mg/dL (ref 100–199)
HDL: 43 mg/dL (ref >39)
LDL Chol Calc (NIH): 100 mg/dL — ABNORMAL HIGH (ref 0–99)
Triglycerides: 193 mg/dL — ABNORMAL HIGH (ref 0–149)
VLDL Cholesterol Cal: 33 mg/dL (ref 5–40)

## 2024-06-02 MED ORDER — ROSUVASTATIN CALCIUM 40 MG PO TABS: 40.0000 mg | ORAL_TABLET | Freq: Every day | ORAL | 3 refills | Status: AC

## 2024-06-02 NOTE — Telephone Encounter (Signed)
-----   Message from [facility] sent at 06/02/2024  8:13 AM EDT ----- LDL still remains elevated at 100.  With the knowledge of coronary atherosclerosis, goal is <70.  Recommend increasing Crestor  to 20 mg daily.  Repeat lipid panel in 3 months.  Thanks MJP

## 2024-06-02 NOTE — Telephone Encounter (Signed)
-----   Message from [facility] sent at 06/02/2024 10:26 AM EDT ----- In the case, lets increase Crestor  to 40 mg daily.  Thanks MJP

## 2024-06-02 NOTE — Telephone Encounter (Signed)
 Spoke with pt regarding his results. Pt is already taking Crestor  20 mg once daily. Pt aware to have labs drawn in 3 months. Will forward to Patwardhan for suggestions on dosage. Pt verbalized understanding. All questions if any were answered.

## 2024-06-02 NOTE — Telephone Encounter (Signed)
 Crestor  40 mg daily ordered and sent to pt's pharmacy of choice.

## 2024-06-02 NOTE — Progress Notes (Signed)
 LDL still remains elevated at 100.  With the knowledge of coronary atherosclerosis, goal is <70.  Recommend increasing Crestor  to 20 mg daily.  Repeat lipid panel in 3 months.  Thanks MJP

## 2024-06-02 NOTE — Progress Notes (Signed)
 In the case, lets increase Crestor  to 40 mg daily.  Thanks MJP

## 2024-06-03 ENCOUNTER — Encounter: Payer: Self-pay | Admitting: Cardiology

## 2024-07-20 ENCOUNTER — Encounter: Payer: Self-pay | Admitting: Pharmacist

## 2024-07-20 ENCOUNTER — Other Ambulatory Visit (HOSPITAL_COMMUNITY): Payer: Self-pay

## 2024-07-20 ENCOUNTER — Telehealth: Payer: Self-pay | Admitting: Pharmacy Technician

## 2024-07-20 ENCOUNTER — Ambulatory Visit: Attending: Internal Medicine | Admitting: Pharmacist

## 2024-07-20 ENCOUNTER — Telehealth: Payer: Self-pay | Admitting: Pharmacist

## 2024-07-20 VITALS — BP 134/82 | HR 61

## 2024-07-20 DIAGNOSIS — I10 Essential (primary) hypertension: Secondary | ICD-10-CM | POA: Insufficient documentation

## 2024-07-20 NOTE — Telephone Encounter (Signed)
 Pharmacy Patient Advocate Encounter  Received notification from [employer] that Prior Authorization for Zepbound has been APPROVED from 06/20/24 to 07/20/25. Ran test claim, Copay is $43.00- one month. This test claim was processed through [facility]- copay amounts may vary at other pharmacies due to pharmacy/plan contracts, or as the patient moves through the different stages of their insurance plan.   PA #/Case ID/Reference #: [id number]

## 2024-07-20 NOTE — Assessment & Plan Note (Signed)
 Assessment: BP is uncontrolled in office BP 134/82 mmHg heart rate 61 (goal <130/80) Currently on lisinopril 40 mg daily and amlodipine  7.5 mg daily ( takes them in the evening) and tolerates them well without any side effects Denies SOB, palpitation, chest pain, headaches,or swelling Reiterated the importance of regular exercise and low salt diet   Plan:  Advised to reduce salt intake. Pt want to implement lifestyle innervation before going on additional BP medications  Continue taking lisinopril 40 mg daily and amlodipine  7.5 mg daily  Patient to keep record of BP readings with heart rate and report to us  at the next visit Patient to see PharmD in 4 weeks for follow up  Follow up lab(s):none

## 2024-07-20 NOTE — Patient Instructions (Signed)
 No Changes made to your BP medications by your pharmacist Robbi Blanch, PharmD at today's visit:     Bring all of your meds, your BP cuff and your record of home blood pressures to your next appointment.    HOW TO TAKE YOUR BLOOD PRESSURE AT HOME  Rest 5 minutes before taking your blood pressure.  Don't smoke or drink caffeinated beverages for at least 30 minutes before. Take your blood pressure before (not after) you eat. Sit comfortably with your back supported and both feet on the floor (don't cross your legs). Elevate your arm to heart level on a table or a desk. Use the proper sized cuff. It should fit smoothly and snugly around your bare upper arm. There should be enough room to slip a fingertip under the cuff. The bottom edge of the cuff should be 1 inch above the crease of the elbow. Ideally, take 3 measurements at one sitting and record the average.  Important lifestyle changes to control high blood pressure  Intervention  Effect on the BP  Lose extra pounds and watch your waistline Weight loss is one of the most effective lifestyle changes for controlling blood pressure. If you're overweight or obese, losing even a small amount of weight can help reduce blood pressure. Blood pressure might go down by about 1 millimeter of mercury (mm Hg) with each kilogram (about 2.2 pounds) of weight lost.  Exercise regularly As a general goal, aim for at least 30 minutes of moderate physical activity every day. Regular physical activity can lower high blood pressure by about 5 to 8 mm Hg.  Eat a healthy diet Eating a diet rich in whole grains, fruits, vegetables, and low-fat dairy products and low in saturated fat and cholesterol. A healthy diet can lower high blood pressure by up to 11 mm Hg.  Reduce salt (sodium) in your diet Even a small reduction of sodium in the diet can improve heart health and reduce high blood pressure by about 5 to 6 mm Hg.  Limit alcohol One drink equals 12 ounces of  beer, 5 ounces of wine, or 1.5 ounces of 80-proof liquor.  Limiting alcohol to less than one drink a day for women or two drinks a day for men can help lower blood pressure by about 4 mm Hg.   If you have any questions or concerns please use My Chart to send questions or call the office at [phone]

## 2024-07-20 NOTE — Progress Notes (Signed)
 Patient ID: Trevor Gomez                 DOB: [birth year]                      MRN: [id number]      HPI: Trevor Gomez is a 68 y.o. male referred by Dr. Elmira to HTN clinic. PMH is significant for  hypertension, OSA, elevated CAC, mitral regurgitation, dilated aortic root    Early June patient's BP was elevated so amlodipine  dose was increased to 7.5 mg and lisinopril 40 mg was continued. Pt was advised to to buy a new blood pressure monitor for home monitoring.  Recent lipid lab LDL was elevated so Crestor  dose was increased from 20 mg to 40 mg.   The patient presented today to the hypertension clinic reporting home blood pressure readings averaging around 136/77 mmHg with a heart rate of 61 bpm; his lowest recorded reading was 128/75 mmHg. He denies symptoms such as dizziness, swelling, or shortness of breath and states that he experiences no symptoms even when his blood pressure is elevated. 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 His primary care provider prescribed Wegovy for weight management, but the pharmacy reported a shortage of the starting dose, which was later determined to be inaccurate. Given his moderate obstructive sleep apnea (OSA), we discussed and the patient agreed to assess insurance coverage for Zepbound, with plans to fill it at [facility]. The patient reports eating healthy, home-cooked meals most days but admits to sprinkling salt on his vegetables and consuming salty snacks such as chips 2-3 times per week. He also acknowledges that late-night snacking is a significant area for improvement. We reviewed proper techniques for measuring blood pressure at home and discussed lifestyle modifications in detail, emphasizing their impact on blood pressure control. Current HTN meds: amlodipine  7.5 mg daily and lisinopril 40 mg daily  Previously tried: none  BP goal: <130/80   Family History:  Relation Problem Comments   Mother (Deceased) Diabetes   Heart attack in her 23's    Stroke in her mid 72's     Father (Deceased) Hypertension     Sister Metallurgist) Thyroid disease     Brother (Deceased) Diabetes     Social History:  Alcohol: 1 drink 2-3 times per week  Smoking: never   Diet: home cooked meals most days of the week  Vegetables - sprinkle salts  Salty snacks - 2-3 times per week   Exercise: weights - 2-3 times per week  1 mile walk daily    Home BP readings: 136/77 mmHg with a heart rate of 61 bpm; his lowest recorded reading was 128/75 mmHg   Wt Readings from Last 3 Encounters:  06/01/24 260 lb 3.2 oz (118 kg)  03/23/24 250 lb (113.4 kg)  01/29/24 264 lb (119.7 kg)   BP Readings from Last 3 Encounters:  07/20/24 134/82  06/01/24 (!) 147/83  01/22/24 138/82   Pulse Readings from Last 3 Encounters:  07/20/24 61  06/01/24 (!) 57  01/22/24 62    Renal function: CrCl cannot be calculated (Patient's most recent lab result is older than the maximum 21 days allowed.).  Past Medical History:  Diagnosis Date   Hypertension     Current Outpatient Medications on File Prior to Visit  Medication Sig Dispense Refill   amLODipine  (NORVASC ) 5 MG tablet Take 1.5 tablets (7.5 mg total)  by mouth daily. 135 tablet 3   azelastine (ASTELIN) 0.1 % nasal spray Place 1 spray into both nostrils 2 (two) times daily.     EPINEPHrine 0.3 mg/0.3 mL IJ SOAJ injection Inject 0.3 mg into the muscle as needed.     levothyroxine (SYNTHROID) 175 MCG tablet Take 175 mcg by mouth daily before breakfast.     lisinopril (ZESTRIL) 40 MG tablet Take 40 mg by mouth daily.     omeprazole (PRILOSEC) 20 MG capsule Take 20 mg by mouth daily.     rosuvastatin  (CRESTOR ) 40 MG tablet Take 1 tablet (40 mg total) by mouth daily. 90 tablet 3   tadalafil (CIALIS) 20 MG tablet Take 20 mg by mouth every morning.     No current facility-administered medications on file prior to visit.    Allergies  Allergen Reactions    Banana     Thickened secretions   Shellfish Allergy     Thickened secretions   Cat Dander Itching    Blood pressure 134/82, pulse 61, SpO2 97%.   Assessment/Plan:  1. Hypertension -  Essential hypertension Assessment: BP is uncontrolled in office BP 134/82 mmHg heart rate 61 (goal <130/80) Currently on lisinopril 40 mg daily and amlodipine  7.5 mg daily ( takes them in the evening) and tolerates them well without any side effects Denies SOB, palpitation, chest pain, headaches,or swelling Reiterated the importance of regular exercise and low salt diet   Plan:  Advised to reduce salt intake. Pt want to implement lifestyle innervation before going on additional BP medications  Continue taking lisinopril 40 mg daily and amlodipine  7.5 mg daily  Patient to keep record of BP readings with heart rate and report to us  at the next visit Patient to see PharmD in 4 weeks for follow up  Follow up lab(s):none       Thank you  Robbi Blanch, Pharm.D [place] [facility]. [facility] [address], [place], [place] [address] Phone: [phone]; Fax: [phone]

## 2024-07-20 NOTE — Telephone Encounter (Signed)
 Pharmacy Patient Advocate Encounter   Received notification from Pt Calls Messages that prior authorization for Zepbound 2.5MG  is required/requested.   Insurance verification completed.   The patient is insured through [employer] .   Per test claim: PA required; PA submitted to above mentioned insurance via CoverMyMeds Key/confirmation #/EOC [id number] Status is pending

## 2024-08-20 NOTE — Progress Notes (Unsigned)
 Patient ID: Trevor Gomez                 DOB: 1956/06/23                      MRN: [id number]      HPI: Trevor Gomez is a 68 y.o. male referred by Dr. Elmira to HTN clinic. PMH is significant for  hypertension, OSA, elevated CAC, mitral regurgitation, dilated aortic root    Early June patient's BP was elevated so amlodipine  dose was increased to 7.5 mg and lisinopril 40 mg was continued. Pt was advised to to buy a new blood pressure monitor for home monitoring.  Recent lipid lab LDL was elevated so Crestor  dose was increased from 20 mg to 40 mg.   The patient presented today to the hypertension clinic reporting home blood pressure readings averaging around 136/77 mmHg with a heart rate of 61 bpm; his lowest recorded reading was 128/75 mmHg. He denies symptoms such as dizziness, swelling, or shortness of breath and states that he experiences no symptoms even when his blood pressure is elevated. 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 His primary care provider prescribed Wegovy for weight management, but the pharmacy reported a shortage of the starting dose, which was later determined to be inaccurate. Given his moderate obstructive sleep apnea (OSA), we discussed and the patient agreed to assess insurance coverage for Zepbound, with plans to fill it at [facility]. The patient reports eating healthy, home-cooked meals most days but admits to sprinkling salt on his vegetables and consuming salty snacks such as chips 2-3 times per week. He also acknowledges that late-night snacking is a significant area for improvement. We reviewed proper techniques for measuring blood pressure at home and discussed lifestyle modifications in detail, emphasizing their impact on blood pressure control. Current HTN meds: amlodipine  7.5 mg daily and lisinopril 40 mg daily  Previously tried: none  BP goal: <130/80   Family History:  Relation Problem Comments   Mother (Deceased) Diabetes   Heart attack in her 73's    Stroke in her mid 78's     Father (Deceased) Hypertension     Sister Metallurgist) Thyroid disease     Brother (Deceased) Diabetes     Social History:  Alcohol: 1 drink 2-3 times per week  Smoking: never   Diet: home cooked meals most days of the week  Vegetables - sprinkle salts  Salty snacks - 2-3 times per week   Exercise: weights - 2-3 times per week  1 mile walk daily    Home BP readings: 136/77 mmHg with a heart rate of 61 bpm; his lowest recorded reading was 128/75 mmHg   Wt Readings from Last 3 Encounters:  06/01/24 260 lb 3.2 oz (118 kg)  03/23/24 250 lb (113.4 kg)  01/29/24 264 lb (119.7 kg)   BP Readings from Last 3 Encounters:  07/20/24 134/82  06/01/24 (!) 147/83  01/22/24 138/82   Pulse Readings from Last 3 Encounters:  07/20/24 61  06/01/24 (!) 57  01/22/24 62    Renal function: CrCl cannot be calculated (Patient's most recent lab result is older than the maximum 21 days allowed.).  Past Medical History:  Diagnosis Date   Hypertension     Current Outpatient Medications on File Prior to Visit  Medication Sig Dispense Refill   amLODipine  (NORVASC ) 5 MG tablet Take 1.5 tablets (7.5 mg total)  by mouth daily. 135 tablet 3   azelastine (ASTELIN) 0.1 % nasal spray Place 1 spray into both nostrils 2 (two) times daily.     EPINEPHrine 0.3 mg/0.3 mL IJ SOAJ injection Inject 0.3 mg into the muscle as needed.     levothyroxine (SYNTHROID) 175 MCG tablet Take 175 mcg by mouth daily before breakfast.     lisinopril (ZESTRIL) 40 MG tablet Take 40 mg by mouth daily.     omeprazole (PRILOSEC) 20 MG capsule Take 20 mg by mouth daily.     rosuvastatin  (CRESTOR ) 40 MG tablet Take 1 tablet (40 mg total) by mouth daily. 90 tablet 3   tadalafil (CIALIS) 20 MG tablet Take 20 mg by mouth every morning.     No current facility-administered medications on file prior to visit.    Allergies  Allergen Reactions    Banana     Thickened secretions   Shellfish Allergy     Thickened secretions   Cat Dander Itching    There were no vitals taken for this visit.   Assessment/Plan:  1. Hypertension -  No problem-specific Assessment & Plan notes found for this encounter.      Thank you  Robbi Blanch, Pharm.D [place] [facility]. [facility] [address], [place], [place] [address] Phone: [phone]; Fax: [phone]

## 2024-08-21 ENCOUNTER — Encounter: Payer: Self-pay | Admitting: Pharmacist

## 2024-08-21 ENCOUNTER — Ambulatory Visit: Attending: Internal Medicine | Admitting: Pharmacist

## 2024-08-21 VITALS — BP 138/82 | HR 60

## 2024-08-21 DIAGNOSIS — I10 Essential (primary) hypertension: Secondary | ICD-10-CM | POA: Insufficient documentation

## 2024-08-21 MED ORDER — HYDROCHLOROTHIAZIDE 25 MG PO TABS: 25.0000 mg | ORAL_TABLET | Freq: Every day | ORAL | 3 refills | Status: AC

## 2024-08-21 NOTE — Assessment & Plan Note (Addendum)
 Assessment: BP is uncontrolled in office BP 138/82 mmHg heart rate 60 (goal <130/80) Currently on lisinopril 40 mg daily and amlodipine  7.5 mg daily ( takes them in the evening) and tolerates them well without any side effects Denies SOB, palpitation, chest pain, headaches,or swelling Reiterated the importance of regular exercise and he has lower the salt intake   Plan:  Start taking hydrochlorothiazide  25 mg daily  Continue taking lisinopril 40 mg daily and amlodipine  7.5 mg daily  Patient to keep record of BP readings with heart rate and report to us  at the next visit Patient to see PharmD in 4 weeks for follow up  Follow up lab(s):BMP today as last BMP was 11/2023 will repeat in 2-3 weeks after starting hydrochlorothiazide 

## 2024-08-21 NOTE — Patient Instructions (Addendum)
 Changes made by your pharmacist Robbi Blanch, PharmD at today's visit:    Instructions/Changes  (what do you need to do) Your Notes  (what you did and when you did it)  Get BMP lab done today and start taking hydrochlorothiazide  25 mg daily. Repeat lab- BMP due in 2-3 weeks   Continue taking amlodipine  7.5 mg daily and lisinopril 40 mg daily    Follow reduce salt diet     Bring all of your meds, your BP cuff and your record of home blood pressures to your next appointment.    HOW TO TAKE YOUR BLOOD PRESSURE AT HOME  Rest 5 minutes before taking your blood pressure.  Don't smoke or drink caffeinated beverages for at least 30 minutes before. Take your blood pressure before (not after) you eat. Sit comfortably with your back supported and both feet on the floor (don't cross your legs). Elevate your arm to heart level on a table or a desk. Use the proper sized cuff. It should fit smoothly and snugly around your bare upper arm. There should be enough room to slip a fingertip under the cuff. The bottom edge of the cuff should be 1 inch above the crease of the elbow. Ideally, take 3 measurements at one sitting and record the average.  Important lifestyle changes to control high blood pressure  Intervention  Effect on the BP  Lose extra pounds and watch your waistline Weight loss is one of the most effective lifestyle changes for controlling blood pressure. If you're overweight or obese, losing even a small amount of weight can help reduce blood pressure. Blood pressure might go down by about 1 millimeter of mercury (mm Hg) with each kilogram (about 2.2 pounds) of weight lost.  Exercise regularly As a general goal, aim for at least 30 minutes of moderate physical activity every day. Regular physical activity can lower high blood pressure by about 5 to 8 mm Hg.  Eat a healthy diet Eating a diet rich in whole grains, fruits, vegetables, and low-fat dairy products and low in saturated fat and  cholesterol. A healthy diet can lower high blood pressure by up to 11 mm Hg.  Reduce salt (sodium) in your diet Even a small reduction of sodium in the diet can improve heart health and reduce high blood pressure by about 5 to 6 mm Hg.  Limit alcohol One drink equals 12 ounces of beer, 5 ounces of wine, or 1.5 ounces of 80-proof liquor.  Limiting alcohol to less than one drink a day for women or two drinks a day for men can help lower blood pressure by about 4 mm Hg.   If you have any questions or concerns please use My Chart to send questions or call the office at [phone]

## 2024-08-22 LAB — BASIC METABOLIC PANEL WITH GFR
BUN/Creatinine Ratio: 13 (ref 10–24)
BUN: 12 mg/dL (ref 8–27)
CO2: 22 mmol/L (ref 20–29)
Calcium: 9 mg/dL (ref 8.6–10.2)
Chloride: 105 mmol/L (ref 96–106)
Creatinine, Ser: 0.89 mg/dL (ref 0.76–1.27)
Glucose: 80 mg/dL (ref 70–99)
Potassium: 4.4 mmol/L (ref 3.5–5.2)
Sodium: 141 mmol/L (ref 134–144)
eGFR: 95 mL/min/1.73 (ref >59)

## 2024-08-25 ENCOUNTER — Ambulatory Visit: Payer: Self-pay | Admitting: Pharmacist

## 2024-08-28 NOTE — Telephone Encounter (Signed)
 Will be discussing Zepbound at the next OV

## 2024-09-25 ENCOUNTER — Ambulatory Visit: Attending: Cardiovascular Disease | Admitting: Pharmacist

## 2024-09-25 ENCOUNTER — Encounter: Payer: Self-pay | Admitting: Pharmacist

## 2024-09-25 ENCOUNTER — Other Ambulatory Visit (HOSPITAL_COMMUNITY): Payer: Self-pay

## 2024-09-25 VITALS — Ht 72.0 in | Wt 252.2 lb

## 2024-09-25 DIAGNOSIS — E6609 Other obesity due to excess calories: Secondary | ICD-10-CM | POA: Diagnosis present

## 2024-09-25 MED ORDER — ZEPBOUND 2.5 MG/0.5ML ~~LOC~~ SOAJ
2.5000 mg | SUBCUTANEOUS | 0 refills | Status: AC
  Filled 2024-09-25 – 2024-10-15 (×4): qty 2, 28d supply, fill #0

## 2024-09-25 NOTE — Assessment & Plan Note (Signed)
 Patient has not met goal of at least 5% of body weight loss with comprehensive lifestyle modifications alone in the past 3-6 months. Pharmacotherapy is appropriate to pursue as augmentation. Will start Zepbound  2.5 mg once a week  . Confirmed patient not has no personal or family history of medullary thyroid carcinoma (MTC) or Multiple Endocrine Neoplasia syndrome type 2 (MEN 2). Injection technique reviewed at today's visit.  Advised patient on common side effects including nausea, diarrhea, dyspepsia, decreased appetite, and fatigue. Counseled patient on reducing meal size and how to titrate medication to minimize side effects. Counseled patient to call if intolerable side effects or if experiencing dehydration, abdominal pain, or dizziness. Along with pharmacotherapy, the patient will follow dietary modifications and aim for at least 150 minutes of moderate-intensity exercise per week, plus resistance training twice a week (as recommended by the American Heart Association). This resistance training--such as weightlifting, bodyweight exercises, or using resistance bands, adapted to the patient's ability--will help prevent muscle loss.  Phone follow-ups will be conducted every 4 weeks for dose titration until the patient reaches the effective therapeutic dose and target weight.

## 2024-09-25 NOTE — Progress Notes (Signed)
 Patient ID: WESLY WHISENANT                 DOB: 1956-06-25                    MRN: [id number]     HPI: Trevor Gomez is a 68 y.o. male patient referred to pharmacy clinic by Dr.Patwardhan to initiate GLP1-RA therapy. PMH is significant for hypertension, OSA, elevated CAC, mitral regurgitation, dilated aortic root , and obesity. Most recent BMI 35.28 kg/m .  Baseline weight and BMI: 264 lbs 35.8 kg/m  Current weight and BMI: 252.2 lbs 34.20 kg/m  Current meds that affect weight: none  Goal weight 210 lbs    Diet:  Brunch- 2 packs of instant oats or cereal or meat and cheese sandwich on white bread  Snack: roasted salted peanuts  Dinner- meat baked ( pork, chicken, beef) squash, green vegetables, rice, pasta - once every other week  Drink: water with zero sugar flavoring agent , 2 cups of coffee, zero sugar soda every other day Snacks at night: cheese it, nuts, pita chips   Exercise: walking 1 mile per day. Resistance - will be restarting   Family History:   Social History:  Alcohol: 1 drink 2-3 times per week ( no more than 1 drink at a time)  Smoking: never  Labs: No results found for: HGBA1C  Wt Readings from Last 1 Encounters:  09/25/24 252 lb 3.2 oz (114.4 kg)    BP Readings from Last 1 Encounters:  08/21/24 138/82   Pulse Readings from Last 1 Encounters:  08/21/24 60       Component Value Date/Time   CHOL 176 06/01/2024 0942   TRIG 193 (H) 06/01/2024 0942   HDL 43 06/01/2024 0942   CHOLHDL 4.1 06/01/2024 0942   LDLCALC 100 (H) 06/01/2024 0942    Past Medical History:  Diagnosis Date   Hypertension     Current Outpatient Medications on File Prior to Visit  Medication Sig Dispense Refill   amLODipine  (NORVASC ) 5 MG tablet Take 1.5 tablets (7.5 mg total) by mouth daily. 135 tablet 3   azelastine (ASTELIN) 0.1 % nasal spray Place 1 spray into both nostrils 2 (two) times daily.     EPINEPHrine 0.3 mg/0.3 mL IJ SOAJ injection Inject 0.3 mg into the muscle as  needed.     hydrochlorothiazide  (HYDRODIURIL ) 25 MG tablet Take 1 tablet (25 mg total) by mouth daily. 90 tablet 3   levothyroxine (SYNTHROID) 175 MCG tablet Take 175 mcg by mouth daily before breakfast.     lisinopril (ZESTRIL) 40 MG tablet Take 40 mg by mouth daily.     omeprazole (PRILOSEC) 20 MG capsule Take 20 mg by mouth daily.     rosuvastatin  (CRESTOR ) 40 MG tablet Take 1 tablet (40 mg total) by mouth daily. 90 tablet 3   tadalafil (CIALIS) 20 MG tablet Take 20 mg by mouth every morning.     No current facility-administered medications on file prior to visit.    Allergies  Allergen Reactions   Banana     Thickened secretions   Shellfish Allergy     Thickened secretions   Cat Dander Itching     Assessment/Plan:  1. Weight loss -   Problem  Obesity Due to Excess Calories   Baseline weight and BMI: 264 lbs 35.8 kg/m  Current weight and BMI: 252.2 lbs 34.20 kg/m  Current meds that affect weight: none  Goal weight 210 lbs  Current therapy - Zepbound  ( started 09/2024)     Obesity due to excess calories Patient has not met goal of at least 5% of body weight loss with comprehensive lifestyle modifications alone in the past 3-6 months. Pharmacotherapy is appropriate to pursue as augmentation. Will start Zepbound  2.5 mg once a week  . Confirmed patient not has no personal or family history of medullary thyroid carcinoma (MTC) or Multiple Endocrine Neoplasia syndrome type 2 (MEN 2). Injection technique reviewed at today's visit.  Advised patient on common side effects including nausea, diarrhea, dyspepsia, decreased appetite, and fatigue. Counseled patient on reducing meal size and how to titrate medication to minimize side effects. Counseled patient to call if intolerable side effects or if experiencing dehydration, abdominal pain, or dizziness. Along with pharmacotherapy, the patient will follow dietary modifications and aim for at least 150 minutes of moderate-intensity  exercise per week, plus resistance training twice a week (as recommended by the American Heart Association). This resistance training--such as weightlifting, bodyweight exercises, or using resistance bands, adapted to the patient's ability--will help prevent muscle loss.  Phone follow-ups will be conducted every 4 weeks for dose titration until the patient reaches the effective therapeutic dose and target weight.    Trevor Gomez, Pharm.D [place] [facility]. [facility] [address], [place], [place] [address] Phone: [phone]; Fax: [phone]

## 2024-10-15 ENCOUNTER — Other Ambulatory Visit (HOSPITAL_COMMUNITY): Payer: Self-pay

## 2024-11-13 ENCOUNTER — Ambulatory Visit: Admitting: Pharmacist

## 2025-06-01 ENCOUNTER — Other Ambulatory Visit (HOSPITAL_COMMUNITY)
